# Patient Record
Sex: Male | Born: 1990 | Race: White | Hispanic: No
Health system: Southern US, Community
[De-identification: ages and names within clinical notes are randomized; demographics above are authoritative.]

---

## 2020-12-23 ENCOUNTER — Encounter: Payer: Self-pay | Admitting: Emergency Medicine

## 2020-12-23 ENCOUNTER — Other Ambulatory Visit: Payer: Self-pay

## 2020-12-23 ENCOUNTER — Emergency Department: Payer: Self-pay

## 2020-12-23 DIAGNOSIS — W01198A Fall on same level from slipping, tripping and stumbling with subsequent striking against other object, initial encounter: Secondary | ICD-10-CM | POA: Insufficient documentation

## 2020-12-23 DIAGNOSIS — S4992XA Unspecified injury of left shoulder and upper arm, initial encounter: Secondary | ICD-10-CM | POA: Insufficient documentation

## 2020-12-23 DIAGNOSIS — Y9364 Activity, baseball: Secondary | ICD-10-CM | POA: Insufficient documentation

## 2020-12-23 MED ORDER — FENTANYL CITRATE (PF) 100 MCG/2ML IJ SOLN
75.0000 ug | Freq: Once | INTRAMUSCULAR | Status: AC
Start: 2020-12-23 — End: 2020-12-23
  Administered 2020-12-23: 75 ug via INTRAVENOUS
  Filled 2020-12-23: qty 2

## 2020-12-23 NOTE — ED Triage Notes (Signed)
Pt presents ambulatory to triage room reports he was playing softball and fell on left shoulder, pt reports he had left shoulder surgery last yr. Pt talks in complete sentences no distress noted

## 2020-12-23 NOTE — ED Notes (Signed)
Provided pt with an ice pack

## 2020-12-24 ENCOUNTER — Emergency Department
Admission: EM | Admit: 2020-12-24 | Discharge: 2020-12-24 | Disposition: A | Discharge status: Home / Self Care | Payer: Self-pay | Attending: Emergency Medicine | Admitting: Emergency Medicine

## 2020-12-24 ENCOUNTER — Emergency Department: Payer: Self-pay

## 2020-12-24 DIAGNOSIS — S4992XA Unspecified injury of left shoulder and upper arm, initial encounter: Secondary | ICD-10-CM

## 2020-12-24 MED ORDER — OXYCODONE-ACETAMINOPHEN 5-325 MG PO TABS
1.0000 | ORAL_TABLET | Freq: Once | ORAL | Status: AC
  Administered 2020-12-24: 1 via ORAL
  Filled 2020-12-24: qty 1

## 2020-12-24 MED ORDER — OXYCODONE-ACETAMINOPHEN 5-325 MG PO TABS: 1.0000 | ORAL_TABLET | ORAL | 0 refills | Status: AC | PRN

## 2020-12-24 NOTE — ED Provider Notes (Signed)
[facility] Emergency Department Provider Note   ____________________________________________   Event Date/Time   First MD Initiated Contact with Patient 12/24/20 0109     (approximate)  I have reviewed the triage vital signs and the nursing notes.   HISTORY  Chief Complaint Shoulder Injury    HPI Aaron Vargas is a 30 y.o. male with no significant past medical history who presents to the ED complain of shoulder pain.  Patient reports that he was playing softball and rounding third base when he slipped and fell onto his right shoulder.  He felt an immediate pop with onset of pain, describes current symptoms as limited to when he dislocated in the past.  He did require surgery for his shoulder in February of last year, has not had any issues since then.  He has had difficulty moving his left arm at the shoulder since the fall, denies any issues with his left elbow or wrist.  He has not noticed any numbness or weakness in his left arm.  He denies any injuries to his right arm or bilateral legs.        History reviewed. No pertinent past medical history.  There are no problems to display for this patient.   History reviewed. No pertinent surgical history.  Prior to Admission medications   Medication Sig Start Date End Date Taking? Authorizing Provider  oxyCODONE-acetaminophen (PERCOCET) 5-325 MG tablet Take 1 tablet by mouth every 4 (four) hours as needed for severe pain. 12/24/20 12/24/21 Yes Chesley Noon, MD    Allergies Patient has no allergy information on record.  No family history on file.  Social History    Review of Systems  Constitutional: No fever/chills Eyes: No visual changes. ENT: No sore throat. Cardiovascular: Denies chest pain. Respiratory: Denies shortness of breath. Gastrointestinal: No abdominal pain.  No nausea, no vomiting.  No diarrhea.  No constipation. Genitourinary: Negative for dysuria. Musculoskeletal: Negative for  back pain.  Positive for left shoulder pain. Skin: Negative for rash. Neurological: Negative for headaches, focal weakness or numbness.  ____________________________________________   PHYSICAL EXAM:  VITAL SIGNS: ED Triage Vitals  Enc Vitals Group     BP 12/23/20 2236 133/80     Pulse Rate 12/23/20 2236 93     Resp 12/23/20 2236 20     Temp 12/23/20 2236 98.3 F (36.8 C)     Temp Source 12/23/20 2236 Oral     SpO2 12/23/20 2236 99 %     Weight 12/23/20 2237 170 lb (77.1 kg)     Height 12/23/20 2237 5\' 9"  (1.753 m)     Head Circumference --      Peak Flow --      Pain Score 12/23/20 2237 8     Pain Loc --      Pain Edu? --      Excl. in GC? --     Constitutional: Alert and oriented. Eyes: Conjunctivae are normal. Head: Atraumatic. Nose: No congestion/rhinnorhea. Mouth/Throat: Mucous membranes are moist. Neck: Normal ROM Cardiovascular: Normal rate, regular rhythm. Grossly normal heart sounds.  2+ radial pulses bilaterally. Respiratory: Normal respiratory effort.  No retractions. Lungs CTAB. Gastrointestinal: Soft and nontender. No distention. Genitourinary: deferred Musculoskeletal: No lower extremity tenderness nor edema.  Diffuse tenderness to left shoulder with no obvious deformity.  No tenderness noted to left elbow or wrist. Neurologic:  Normal speech and language. No gross focal neurologic deficits are appreciated. Skin:  Skin is warm, dry and intact. No rash noted.  Psychiatric: Mood and affect are normal. Speech and behavior are normal.  ____________________________________________   LABS (all labs ordered are listed, but only abnormal results are displayed)  Labs Reviewed - No data to display   PROCEDURES  Procedure(s) performed (including Critical Care):  Procedures   ____________________________________________   INITIAL IMPRESSION / ASSESSMENT AND PLAN / ED COURSE       31 year old male with past medical history of shoulder dislocation  status post repair who presents to the ED complaining of left shoulder pain and concern for dislocation after tripping and falling onto the shoulder.  He is neurovascular intact to his left upper extremity.  X-ray reviewed by me and shows no obvious dislocation or fracture.  Given significant ongoing pain, we will further assess with CT scan and give dose of Percocet.  CT scan appears most consistent with AC injury and patient does have significant tenderness at the [facility] joint. No evidence of fracture or dislocation on CT scan. This does redemonstrate small bony growth, likely benign per radiology. Patient was informed of these findings and counseled to follow-up with his orthopedist back in [place]. He was counseled to return to the ED for new worsening symptoms, patient agrees with plan.      ____________________________________________   FINAL CLINICAL IMPRESSION(S) / ED DIAGNOSES  Final diagnoses:  Injury of left acromioclavicular joint, initial encounter     ED Discharge Orders         Ordered    oxyCODONE-acetaminophen (PERCOCET) 5-325 MG tablet  Every 4 hours PRN        12/24/20 0404           Note:  This document was prepared using Dragon voice recognition software and may include unintentional dictation errors.   Chesley Noon, MD 12/24/20 [phone]

## 2021-08-20 IMAGING — CT CT SHOULDER*L* W/O CM
3 series · 12 of 35 positions shown, 14 images · non-contrast
Comparison: Radiograph 12/23/2020

CLINICAL DATA: Fall, left shoulder

EXAM:
CT OF THE UPPER LEFT EXTREMITY WITHOUT CONTRAST
TECHNIQUE: Multidetector CT imaging of the upper left extremity was performed
according to the standard protocol.

[Series 7: ax st · axial · 0.45mm/px · z∈[-786,-638]mm · 4 of 108 slices shown, 5 images]
[im 17/108  soft-tissue]
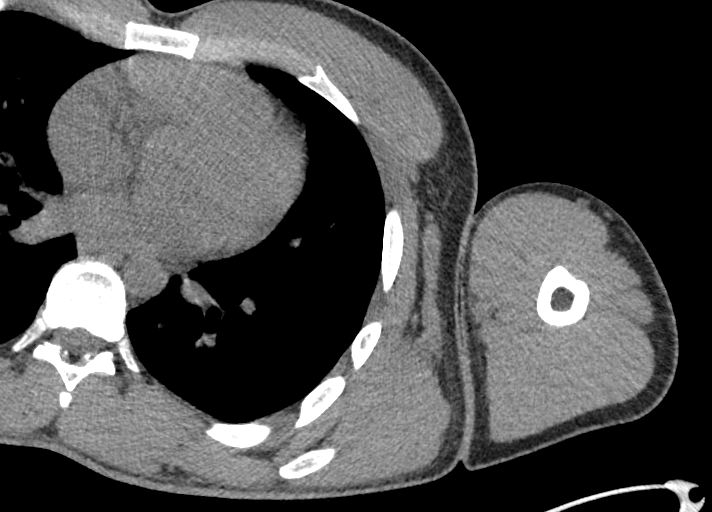
[im 17/108  bone]
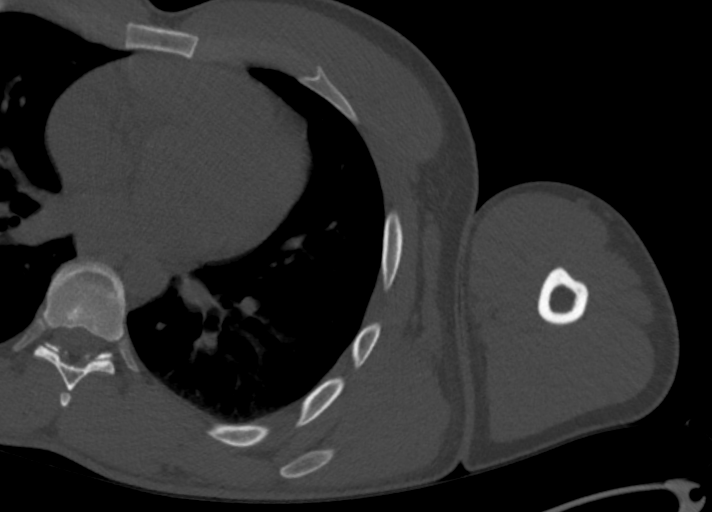
[im 42/108  bone]
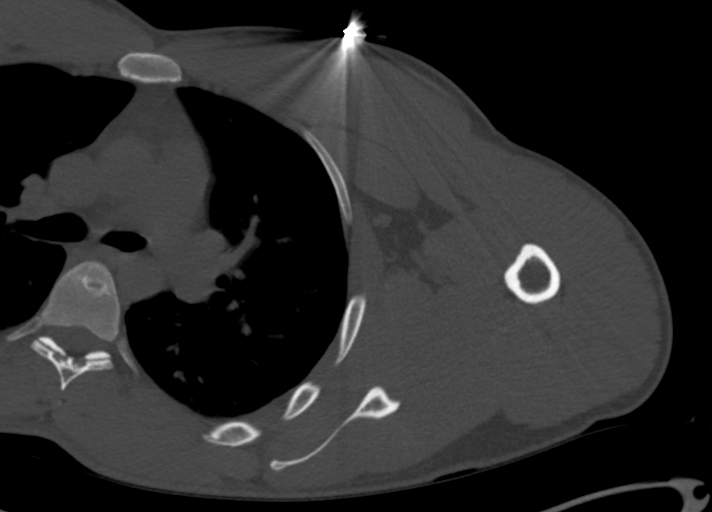
[im 66/108  bone]
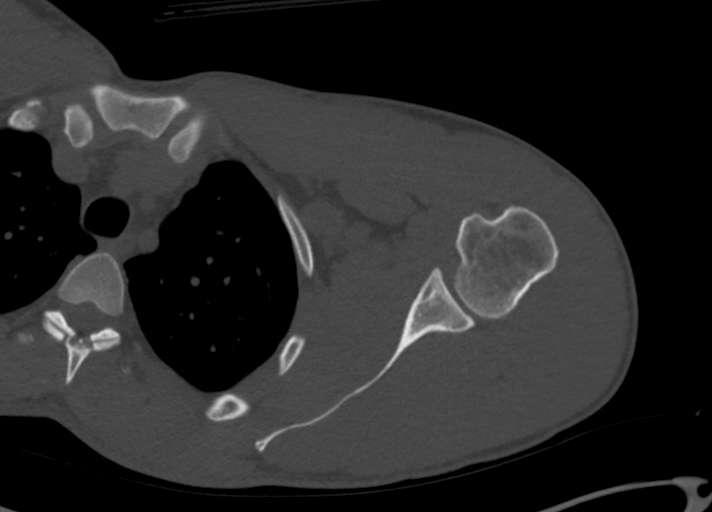
[im 91/108  bone]
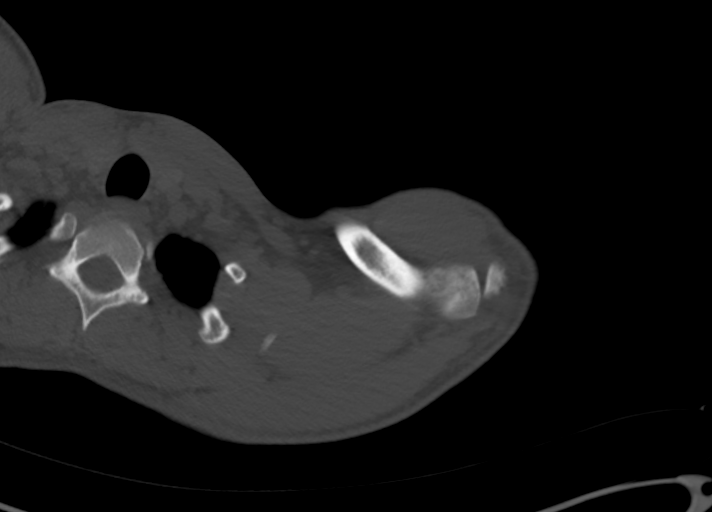

[Series 8: cor st · coronal · 0.46mm/px · 3 of 116 slices shown]
[im 40/116  bone]
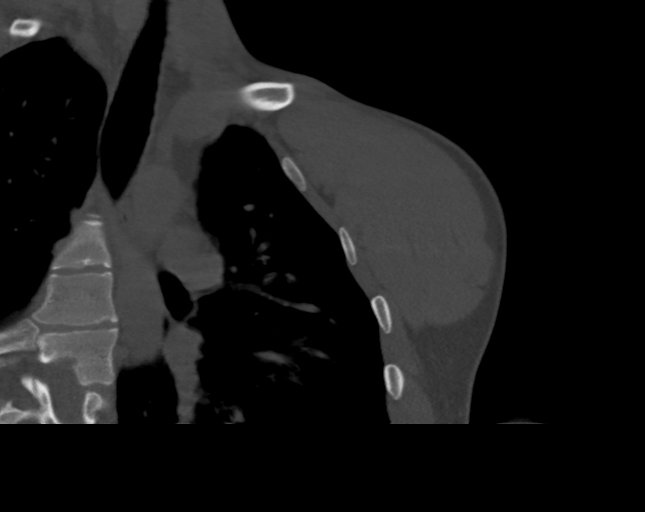
[im 52/116  bone]
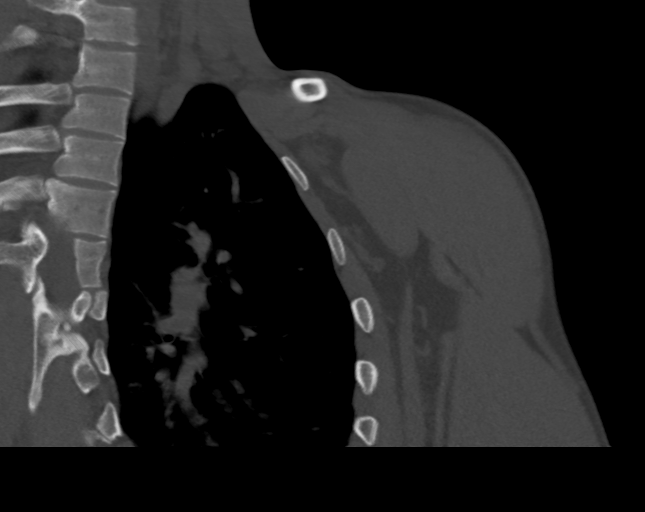
[im 64/116  bone]
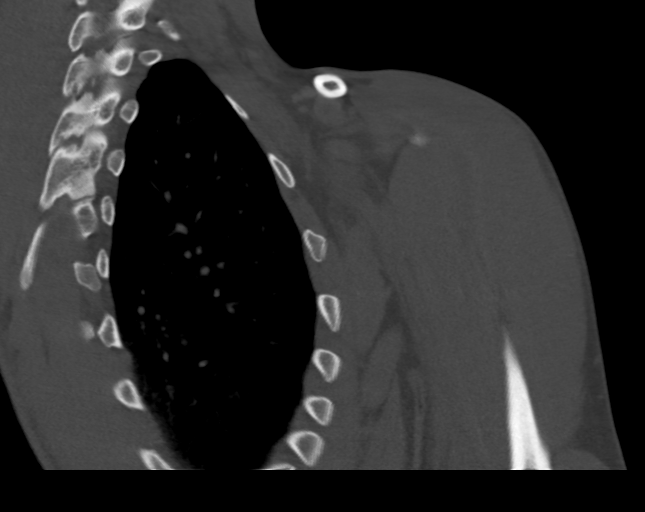

[Series 9: sag st · sagittal · 0.45mm/px · 5 of 135 slices shown, 6 images]
[im 45/135  bone]
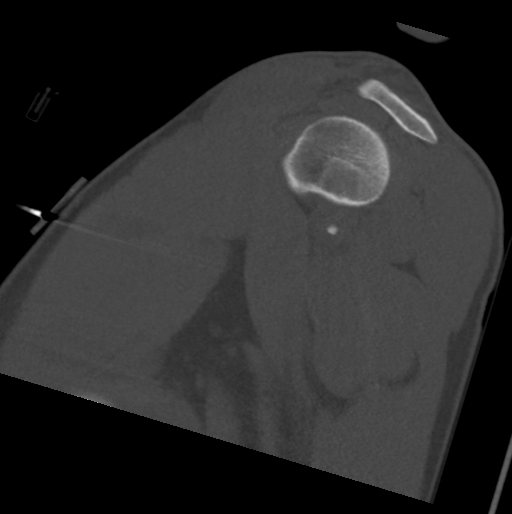
[im 56/135  bone]
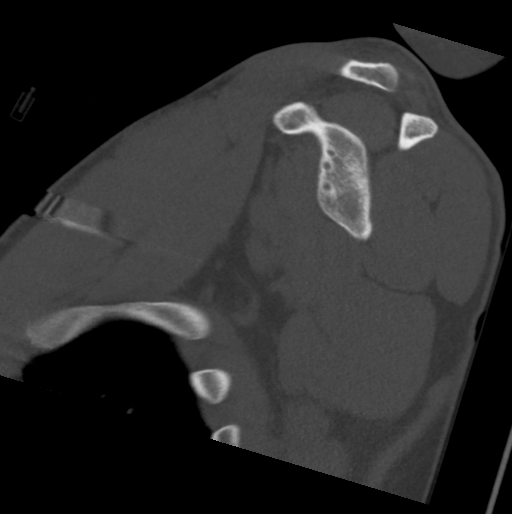
[im 68/135  soft-tissue]
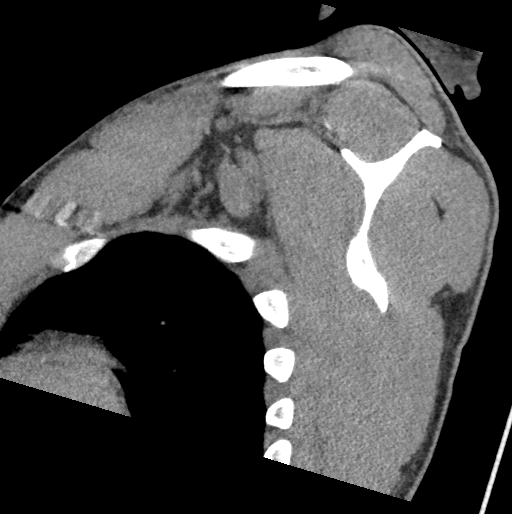
[im 68/135  bone]
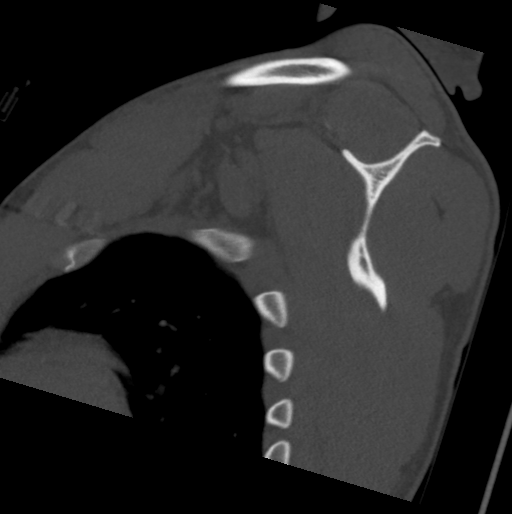
[im 79/135  bone]
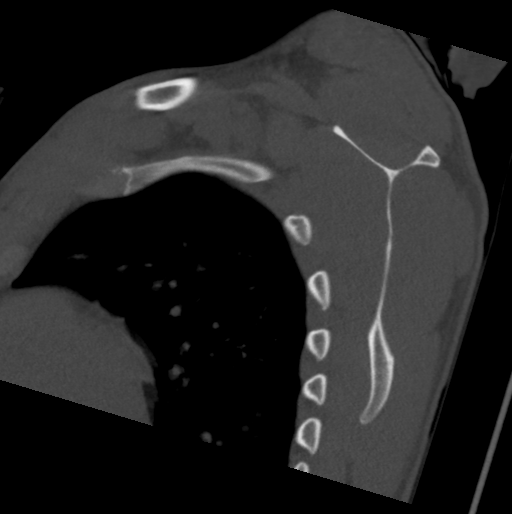
[im 90/135  bone]
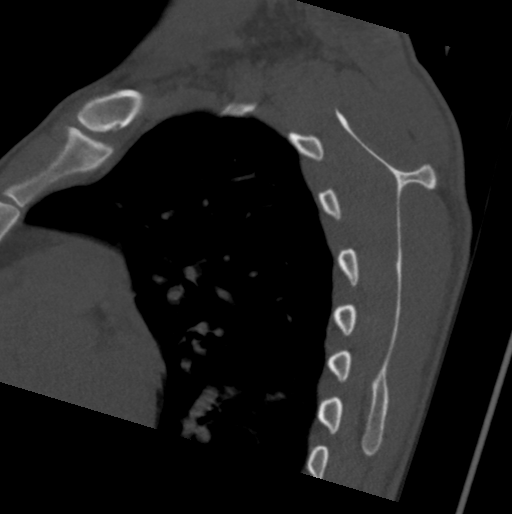

[12 of 35 positions shown; findings below may reference images not displayed]

FINDINGS: Bones/Joint/Cartilage

Slight elevation of the distal clavicular head relative to the
acromion, more conspicuous on the CT images with some adjacent soft
tissue swelling, could reflect an acromioclavicular injury (Ruchika
type 2). There are radiolucent tracks along the posterolateral
humeral head and along the glenoid which can reflect postsurgical
changes from prior Holger Mario repair and surgical stabilization. Remote
appearing Hill-Sachs deformity is present. Mild soft tissue swelling
is noted as well. Tiny bony excrescence along the proximal humeral
metaphysis again is most suggestive of a benign process such as
osteochondroma.

Ligaments

Suboptimally assessed by CT.

Muscles and Tendons

No clear retracted, torn or subluxed tendons within the limitations
of CT imaging. Remaining musculature is unremarkable.

Soft tissues

Minimal soft tissue thickening about the left acromioclavicular
joint. More mild soft tissue swelling diffusely. No soft tissue gas
or foreign body. Included portions of the chest wall, lungs and
mediastinum are unremarkable.
IMPRESSION: 1. Slight elevation of the distal clavicular head relative to the
acromion, more conspicuous on the CT images with some adjacent soft
tissue swelling, could reflect an acromioclavicular injury (Ruchika
type 2). Correlate for point tenderness.
2. Remote appearing Hill-Sachs deformity. Postsurgical changes along
the posterolateral humeral head and the anteroinferior glenoid, may
reflect postsurgical repair of a Bankart lesion and surgical
stabilization in the setting of recurrent dislocation. Correlate
with operative report if available.
3. Tiny bony excrescence along the proximal humeral metaphysis again
is most suggestive of a benign process such as osteo chondroma.
Given that this patient has a history of recurrent dislocations and
surgery, presumably there is outside imaging unavailable in the
system at time of interpretation. Consider correlation with outside
images for further assessment.

These results were called by telephone at the time of interpretation
on 12/24/2020 at [DATE] to provider KATAKITI DEDEI , who verbally
acknowledged these results.
